# Patient Record
Sex: Female | Born: 1985 | Race: Black or African American | Hispanic: No | Marital: Single | Smoking: Current every day smoker
Health system: Southern US, Community
[De-identification: ages and names within clinical notes are randomized; demographics above are authoritative.]

---

## 2013-12-07 ENCOUNTER — Emergency Department (HOSPITAL_BASED_OUTPATIENT_CLINIC_OR_DEPARTMENT_OTHER)
Admission: EM | Admit: 2013-12-07 | Discharge: 2013-12-07 | Disposition: A | Discharge status: Home / Self Care | Payer: Medicaid - Out of State | Attending: Emergency Medicine | Admitting: Emergency Medicine

## 2013-12-07 ENCOUNTER — Encounter (HOSPITAL_BASED_OUTPATIENT_CLINIC_OR_DEPARTMENT_OTHER): Payer: Self-pay

## 2013-12-07 ENCOUNTER — Emergency Department (HOSPITAL_BASED_OUTPATIENT_CLINIC_OR_DEPARTMENT_OTHER): Payer: Medicaid - Out of State

## 2013-12-07 DIAGNOSIS — R112 Nausea with vomiting, unspecified: Secondary | ICD-10-CM | POA: Insufficient documentation

## 2013-12-07 DIAGNOSIS — Z3202 Encounter for pregnancy test, result negative: Secondary | ICD-10-CM | POA: Diagnosis not present

## 2013-12-07 DIAGNOSIS — Z720 Tobacco use: Secondary | ICD-10-CM | POA: Diagnosis not present

## 2013-12-07 DIAGNOSIS — R1084 Generalized abdominal pain: Secondary | ICD-10-CM

## 2013-12-07 DIAGNOSIS — H538 Other visual disturbances: Secondary | ICD-10-CM | POA: Diagnosis not present

## 2013-12-07 DIAGNOSIS — Z9889 Other specified postprocedural states: Secondary | ICD-10-CM | POA: Diagnosis not present

## 2013-12-07 DIAGNOSIS — R519 Headache, unspecified: Secondary | ICD-10-CM

## 2013-12-07 DIAGNOSIS — R51 Headache: Secondary | ICD-10-CM | POA: Diagnosis not present

## 2013-12-07 LAB — COMPREHENSIVE METABOLIC PANEL
ALT: 25 U/L (ref 0–35)
AST: 24 U/L (ref 0–37)
Albumin: 4.2 g/dL (ref 3.5–5.2)
Alkaline Phosphatase: 49 U/L (ref 39–117)
Anion gap: 12 (ref 5–15)
BUN: 10 mg/dL (ref 6–23)
CALCIUM: 10.3 mg/dL (ref 8.4–10.5)
CO2: 27 mEq/L (ref 19–32)
CREATININE: 0.7 mg/dL (ref 0.50–1.10)
Chloride: 102 mEq/L (ref 96–112)
GFR calc non Af Amer: >90 mL/min (ref >90)
Glucose, Bld: 98 mg/dL (ref 70–99)
Potassium: 4.6 mEq/L (ref 3.7–5.3)
Sodium: 141 mEq/L (ref 137–147)
Total Bilirubin: 0.6 mg/dL (ref 0.3–1.2)
Total Protein: 8.2 g/dL (ref 6.0–8.3)

## 2013-12-07 LAB — CBC
HCT: 40.6 % (ref 36.0–46.0)
HEMOGLOBIN: 12.9 g/dL (ref 12.0–15.0)
MCH: 27.8 pg (ref 26.0–34.0)
MCHC: 31.8 g/dL (ref 30.0–36.0)
MCV: 87.5 fL (ref 78.0–100.0)
Platelets: 299 10*3/uL (ref 150–400)
RBC: 4.64 MIL/uL (ref 3.87–5.11)
RDW: 12.3 % (ref 11.5–15.5)
WBC: 4.3 10*3/uL (ref 4.0–10.5)

## 2013-12-07 LAB — URINALYSIS, ROUTINE W REFLEX MICROSCOPIC
BILIRUBIN URINE: NEGATIVE
Glucose, UA: NEGATIVE mg/dL
HGB URINE DIPSTICK: NEGATIVE
KETONES UR: NEGATIVE mg/dL
Leukocytes, UA: NEGATIVE
NITRITE: NEGATIVE
Protein, ur: NEGATIVE mg/dL
SPECIFIC GRAVITY, URINE: 1.021 (ref 1.005–1.030)
UROBILINOGEN UA: 0.2 mg/dL (ref 0.0–1.0)
pH: 6.5 (ref 5.0–8.0)

## 2013-12-07 LAB — PREGNANCY, URINE: PREG TEST UR: NEGATIVE

## 2013-12-07 LAB — LIPASE, BLOOD: LIPASE: 18 U/L (ref 11–59)

## 2013-12-07 MED ORDER — SODIUM CHLORIDE 0.9 % IV BOLUS (SEPSIS)
1000.0000 mL | Freq: Once | INTRAVENOUS | Status: AC
  Administered 2013-12-07: 1000 mL via INTRAVENOUS

## 2013-12-07 MED ORDER — ONDANSETRON HCL 4 MG/2ML IJ SOLN
4.0000 mg | Freq: Once | INTRAMUSCULAR | Status: AC
  Administered 2013-12-07: 4 mg via INTRAVENOUS
  Filled 2013-12-07: qty 2

## 2013-12-07 MED ORDER — METOCLOPRAMIDE HCL 5 MG/ML IJ SOLN
10.0000 mg | Freq: Once | INTRAMUSCULAR | Status: AC
  Administered 2013-12-07: 10 mg via INTRAVENOUS
  Filled 2013-12-07: qty 2

## 2013-12-07 MED ORDER — KETOROLAC TROMETHAMINE 30 MG/ML IJ SOLN
30.0000 mg | Freq: Once | INTRAMUSCULAR | Status: AC
  Administered 2013-12-07: 30 mg via INTRAVENOUS
  Filled 2013-12-07: qty 1

## 2013-12-07 MED ORDER — DIPHENHYDRAMINE HCL 50 MG/ML IJ SOLN
12.5000 mg | Freq: Once | INTRAMUSCULAR | Status: AC
Start: 2013-12-07 — End: 2013-12-07
  Administered 2013-12-07: 12.5 mg via INTRAVENOUS
  Filled 2013-12-07: qty 1

## 2013-12-07 MED ORDER — PROCHLORPERAZINE EDISYLATE 5 MG/ML IJ SOLN
10.0000 mg | Freq: Once | INTRAMUSCULAR | Status: DC
  Filled 2013-12-07: qty 2

## 2013-12-07 NOTE — ED Notes (Signed)
C/o vomiting x 3 days-then states HA and blurred vision started last week

## 2013-12-07 NOTE — ED Provider Notes (Signed)
CSN: [id number]     Arrival date & time 12/07/13  1145 History   First MD Initiated Contact with Patient 12/07/13 1353     Chief Complaint  Patient presents with  . Emesis  . Headache     (Consider location/radiation/quality/duration/timing/severity/associated sxs/prior Treatment) Patient is a 28 y.o. female presenting with vomiting and headaches. The history is provided by the patient and medical records. No language interpreter was used.  Emesis Associated symptoms: abdominal pain and headaches   Associated symptoms: no diarrhea   Headache Associated symptoms: abdominal pain and vomiting   Associated symptoms: no back pain, no cough, no diarrhea, no fatigue, no fever, no nausea and no neck stiffness      Nichole Ross is a 28 y.o. female  with no major medical Hx presents to the Emergency Department complaining of gradual, persistent, progressively worsening headache onset 8 days ago.  Pain described as frontal, throbbing and sharp pain. Associated symptoms includes intermittent blurred vision which began with the headache, but is not present how.  Pt reports that 3 days ago she began to have vomiting.  Pt with 3 episodes of NBNB emesis in the last 3 days.  Pt reports lower abd pain beginning after her bouts of emesis.  She has not attempted ant treatments PTA and has no hx of migraines.  Pt denies numbness, tingling, paresthesias, fever, chill, neck stiffness, rash, vaginal discharge.  Pt denies aggravating or alleviating factors.       History reviewed. No pertinent past medical history. Past Surgical History  Procedure Laterality Date  . Cesarean section     No family history on file. History  Substance Use Topics  . Smoking status: Current Every Day Smoker  . Smokeless tobacco: Not on file  . Alcohol Use: No   OB History    No data available     Review of Systems  Constitutional: Negative for fever, diaphoresis, appetite change, fatigue and unexpected weight change.  HENT:  Negative for mouth sores.   Eyes: Negative for visual disturbance.  Respiratory: Negative for cough, chest tightness, shortness of breath and wheezing.   Cardiovascular: Negative for chest pain.  Gastrointestinal: Positive for vomiting and abdominal pain. Negative for nausea, diarrhea and constipation.  Endocrine: Negative for polydipsia, polyphagia and polyuria.  Genitourinary: Negative for dysuria, urgency, frequency and hematuria.  Musculoskeletal: Negative for back pain and neck stiffness.  Skin: Negative for rash.  Allergic/Immunologic: Negative for immunocompromised state.  Neurological: Positive for headaches. Negative for syncope and light-headedness.  Hematological: Does not bruise/bleed easily.  Psychiatric/Behavioral: Negative for sleep disturbance. The patient is not nervous/anxious.       Allergies  Review of patient's allergies indicates no known allergies.  Home Medications   Prior to Admission medications   Not on File   BP 101/59 mmHg  Pulse 70  Temp(Src) 97.8 F (36.6 C) (Oral)  Resp 16  Ht 5\' 3"  (1.6 m)  Wt 180 lb (81.647 kg)  BMI 31.89 kg/m2  SpO2 100%  LMP 11/24/2013 Physical Exam  Constitutional: She is oriented to person, place, and time. She appears well-developed and well-nourished. No distress.  HENT:  Head: Normocephalic and atraumatic.  Mouth/Throat: Oropharynx is clear and moist.  Eyes: Conjunctivae and EOM are normal. Pupils are equal, round, and reactive to light. No scleral icterus.  No horizontal, vertical or rotational nystagmus  Neck: Normal range of motion. Neck supple.  Full active and passive ROM without pain No midline or paraspinal tenderness No nuchal rigidity or  meningeal signs  Cardiovascular: Normal rate, regular rhythm, normal heart sounds and intact distal pulses.   No murmur heard. Pulmonary/Chest: Effort normal and breath sounds normal. No respiratory distress. She has no wheezes. She has no rales.  Abdominal: Soft.  Bowel sounds are normal. There is generalized tenderness. There is no rebound, no guarding and no CVA tenderness.  Obese Soft and mildly tender throughout  Musculoskeletal: Normal range of motion.  Lymphadenopathy:    She has no cervical adenopathy.  Neurological: She is alert and oriented to person, place, and time. She has normal reflexes. No cranial nerve deficit. She exhibits normal muscle tone. Coordination normal.  Mental Status:  Alert, oriented, thought content appropriate. Speech fluent without evidence of aphasia. Able to follow 2 step commands without difficulty.  Cranial Nerves:  II:  Peripheral visual fields grossly normal, pupils equal, round, reactive to light III,IV, VI: ptosis not present, extra-ocular motions intact bilaterally  V,VII: smile symmetric, facial light touch sensation equal VIII: hearing grossly normal bilaterally  IX,X: gag reflex present  XI: bilateral shoulder shrug equal and strong XII: midline tongue extension  Motor:  5/5 in upper and lower extremities bilaterally including strong and equal grip strength and dorsiflexion/plantar flexion Sensory: Pinprick and light touch normal in all extremities.  Deep Tendon Reflexes: 2+ and symmetric  Cerebellar: normal finger-to-nose with bilateral upper extremities Gait: normal gait and balance CV: distal pulses palpable throughout   Skin: Skin is warm and dry. No rash noted. She is not diaphoretic.  Psychiatric: She has a normal mood and affect. Her behavior is normal. Judgment and thought content normal.  Nursing note and vitals reviewed.   ED Course  Procedures (including critical care time) Labs Review Labs Reviewed  URINALYSIS, ROUTINE W REFLEX MICROSCOPIC  CBC  PREGNANCY, URINE  COMPREHENSIVE METABOLIC PANEL  LIPASE, BLOOD    Imaging Review Ct Head Wo Contrast  12/07/2013   CLINICAL DATA:  Headache and blurred vision beginning last week with vomiting.  EXAM: CT HEAD WITHOUT CONTRAST  TECHNIQUE:  Contiguous axial images were obtained from the base of the skull through the vertex without intravenous contrast.  COMPARISON:  None.  FINDINGS: The brain appears normal without hemorrhage, infarct, mass lesion, mass effect, midline shift or abnormal extra-axial fluid collection. No hydrocephalus or pneumocephalus. The calvarium is intact. Imaged paranasal sinuses and mastoid air cells are clear.  IMPRESSION: Negative head CT.   Electronically Signed   By: Drusilla Kannerhomas  Dalessio M.D.   On: 12/07/2013 15:02     EKG Interpretation None      MDM   Final diagnoses:  Headache  Generalized abdominal pain  Non-intractable vomiting with nausea, vomiting of unspecified type   Nichole Ross presents with frontal headache and generalized abd pain.  Pt HA treated and improved while in ED.  Presentation is like pts previous HA and non concerning [facility], ICH, Meningitis, or temporal arteritis. Pt is afebrile with no focal neuro deficits, nuchal rigidity, or change in vision. Pt is to follow up with PCP and neurology to discuss prophylactic medication. Pt verbalizes understanding and is agreeable with plan to dc.   Pt with completely resolved abdominal pain after resolution of headache. No emesis here in the emergency department and she is tolerating by mouth without difficulty. Repeat abdominal exam is soft and without tenderness.  No concern for diverticulitis, UTI, ectopic pregnancy, appendicitis, cholecystitis or colitis.  I have personally reviewed patient's vitals, nursing note and any pertinent labs or imaging.  I performed an  undressed physical exam.    It has been determined that no acute conditions requiring further emergency intervention are present at this time. The patient/guardian have been advised of the diagnosis and plan. I reviewed all labs and imaging including any potential incidental findings. We have discussed signs and symptoms that warrant return to the ED and they are listed in the discharge  instructions.    Vital signs are stable at discharge.   BP 101/59 mmHg  Pulse 70  Temp(Src) 97.8 F (36.6 C) (Oral)  Resp 16  Ht 5\' 3"  (1.6 m)  Wt 180 lb (81.647 kg)  BMI 31.89 kg/m2  SpO2 100%  LMP 11/24/2013         Dierdre ForthHannah Ival Basquez, PA-C 12/07/13 1656  Warnell Foresterrey Wofford, MD 12/07/13 1731

## 2013-12-07 NOTE — Discharge Instructions (Signed)
1. Medications: usual home medications 2. Treatment: rest, drink plenty of fluids,  3. Follow Up: Please followup with your primary doctor and or neurology in 7 days for discussion of your diagnoses and further evaluation after today's visit; if you do not have a primary care doctor use the resource guide provided to find one; Please return to the ER for worsening symptoms,    General Headache Without Cause A headache is pain or discomfort felt around the head or neck area. The specific cause of a headache may not be found. There are many causes and types of headaches. A few common ones are:  Tension headaches.  Migraine headaches.  Cluster headaches.  Chronic daily headaches. HOME CARE INSTRUCTIONS   Keep all follow-up appointments with your caregiver or any specialist referral.  Only take over-the-counter or prescription medicines for pain or discomfort as directed by your caregiver.  Lie down in a dark, quiet room when you have a headache.  Keep a headache journal to find out what may trigger your migraine headaches. For example, write down:  What you eat and drink.  How much sleep you get.  Any change to your diet or medicines.  Try massage or other relaxation techniques.  Put ice packs or heat on the head and neck. Use these 3 to 4 times per day for 15 to 20 minutes each time, or as needed.  Limit stress.  Sit up straight, and do not tense your muscles.  Quit smoking if you smoke.  Limit alcohol use.  Decrease the amount of caffeine you drink, or stop drinking caffeine.  Eat and sleep on a regular schedule.  Get 7 to 9 hours of sleep, or as recommended by your caregiver.  Keep lights dim if bright lights bother you and make your headaches worse. SEEK MEDICAL CARE IF:   You have problems with the medicines you were prescribed.  Your medicines are not working.  You have a change from the usual headache.  You have nausea or vomiting. SEEK IMMEDIATE MEDICAL  CARE IF:   Your headache becomes severe.  You have a fever.  You have a stiff neck.  You have loss of vision.  You have muscular weakness or loss of muscle control.  You start losing your balance or have trouble walking.  You feel faint or pass out.  You have severe symptoms that are different from your first symptoms. MAKE SURE YOU:   Understand these instructions.  Will watch your condition.  Will get help right away if you are not doing well or get worse. Document Released: 12/23/2004 Document Revised: 03/17/2011 Document Reviewed: 01/08/2011 [facility] Patient Information 2015 [employer] [place]. This information is not intended to replace advice given to you by your health care provider. Make sure you discuss any questions you have with your health care provider.

## 2016-02-12 IMAGING — CT CT HEAD W/O CM
1 series · 16 of 30 positions shown, 20 images · non-contrast
Comparison: None.

CLINICAL DATA: Headache and blurred vision beginning last week with
vomiting.

EXAM:
CT HEAD WITHOUT CONTRAST
TECHNIQUE: Contiguous axial images were obtained from the base of the skull
through the vertex without intravenous contrast.

[Series 2: head 4.8 h37s · axial · 0.45mm/px · z∈[+1164,+1297]mm · 16 of 32 slices shown, 20 images]
[im 2/32  brain]
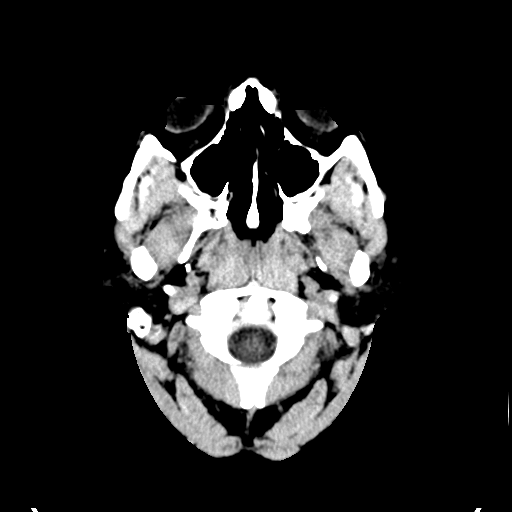
[im 2/32  bone]
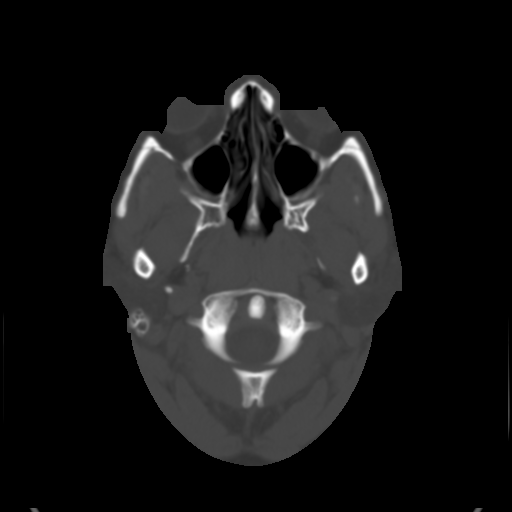
[im 4/32  brain]
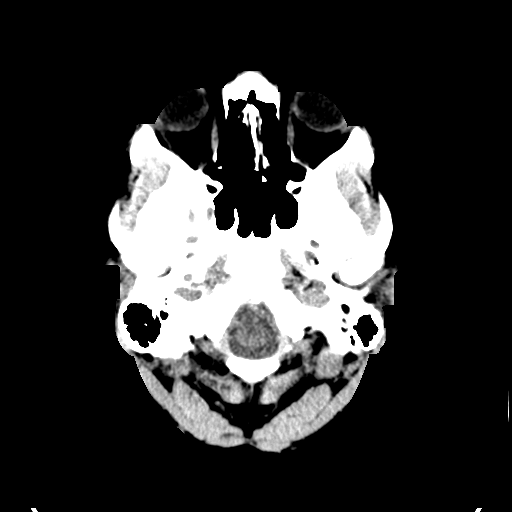
[im 6/32  brain]
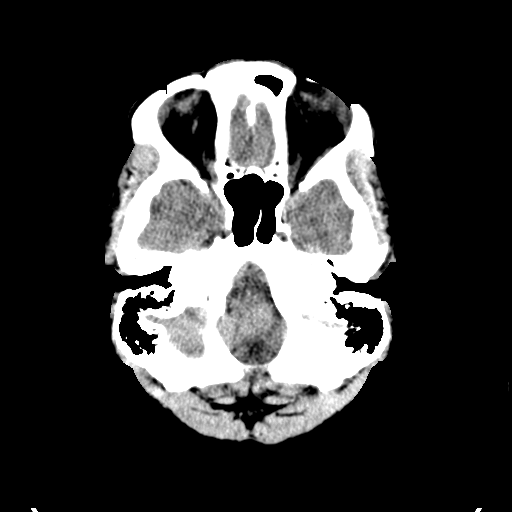
[im 8/32  brain]
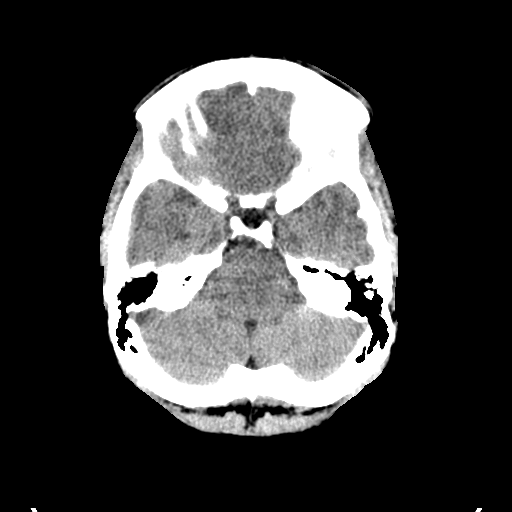
[im 9/32  brain]
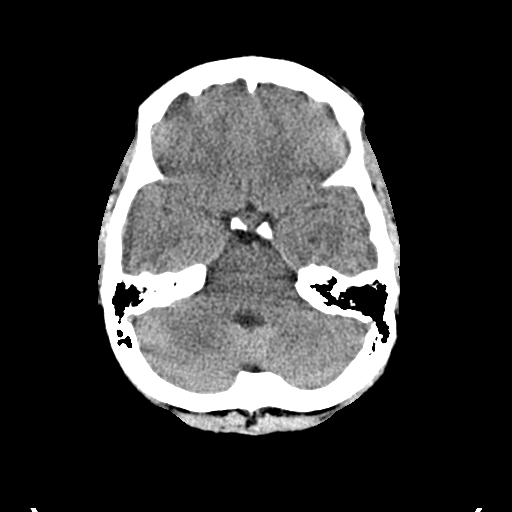
[im 9/32  bone]
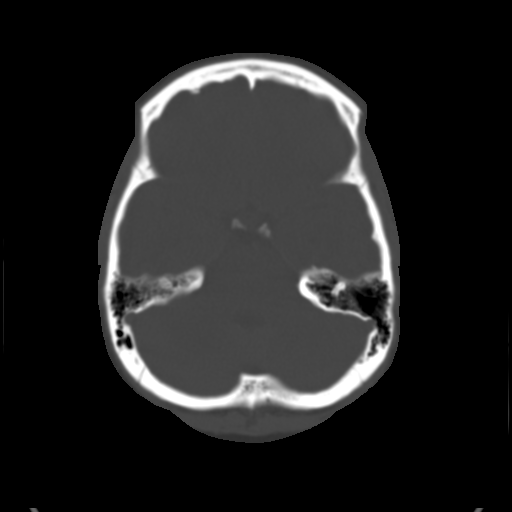
[im 11/32  brain]
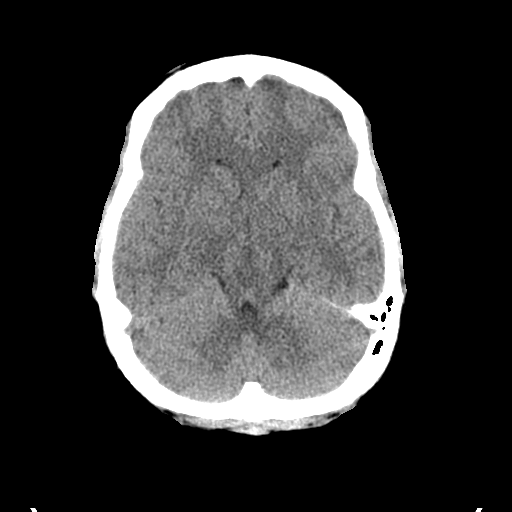
[im 13/32  brain]
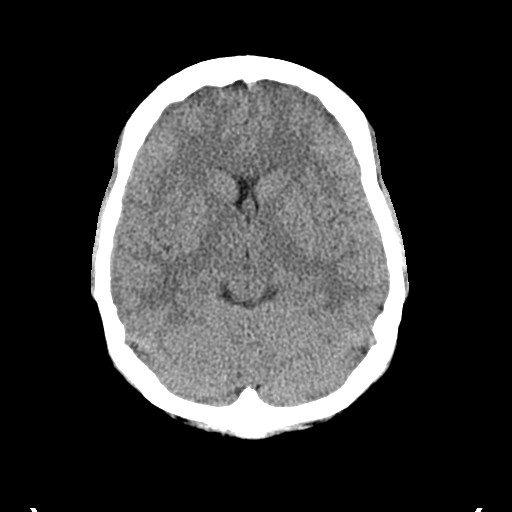
[im 15/32  brain]
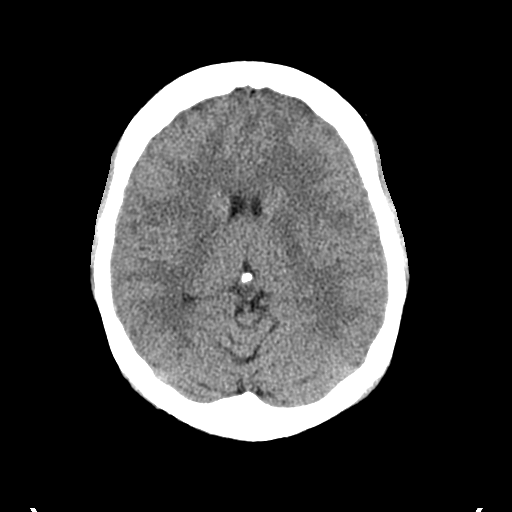
[im 17/32  brain]
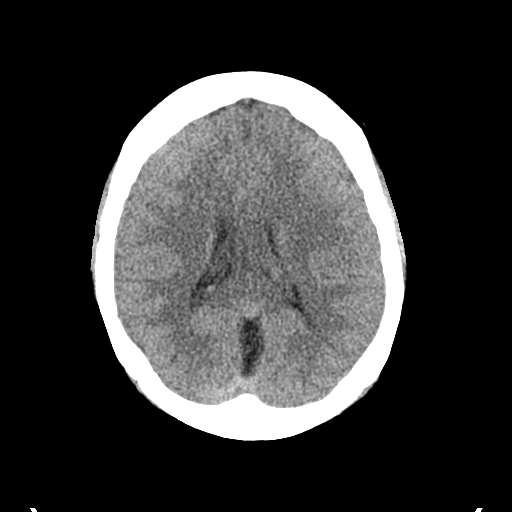
[im 17/32  bone]
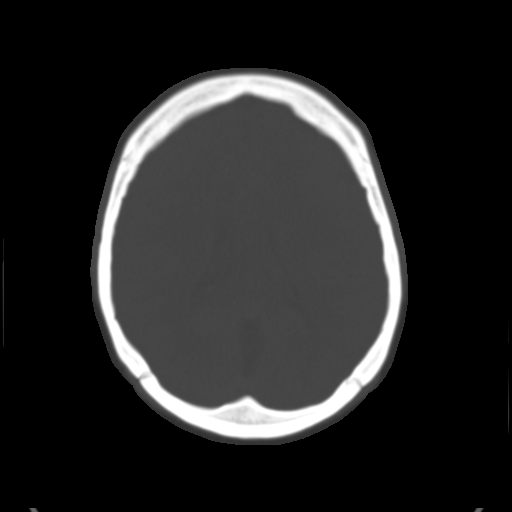
[im 19/32  brain]
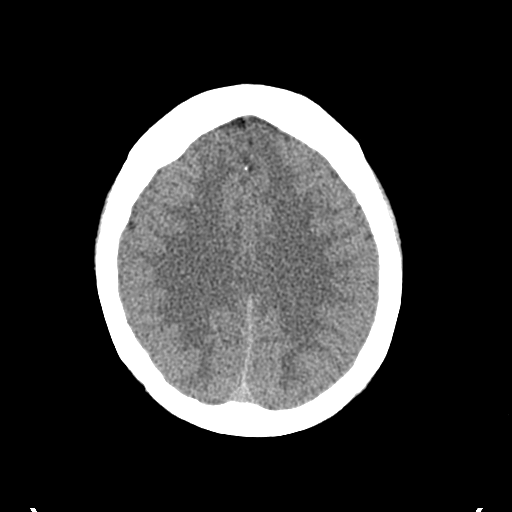
[im 21/32  brain]
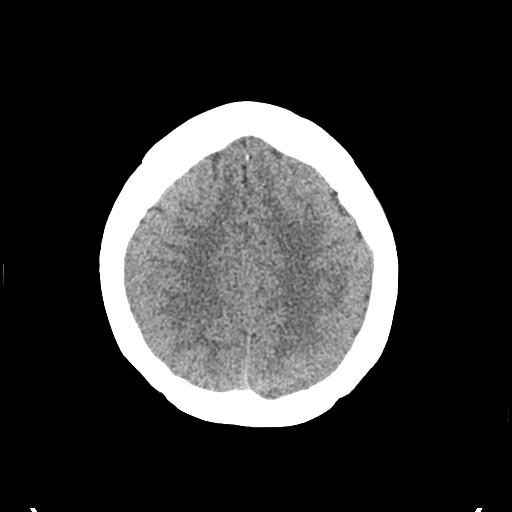
[im 23/32  brain]
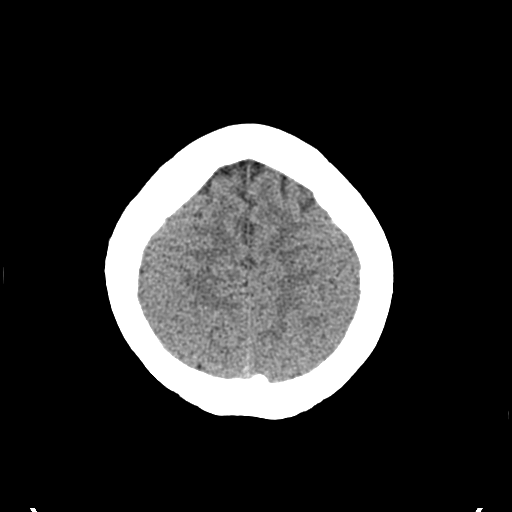
[im 24/32  brain]
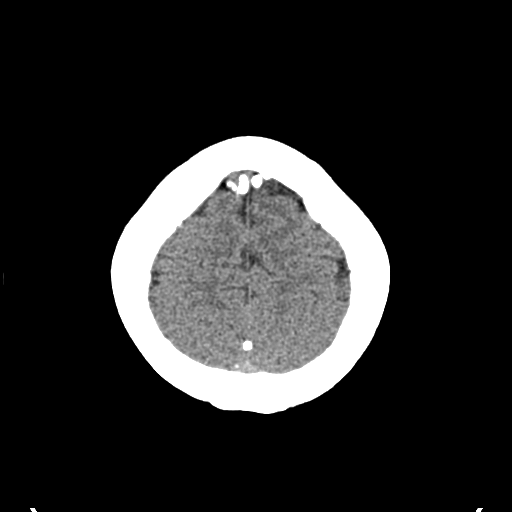
[im 24/32  bone]
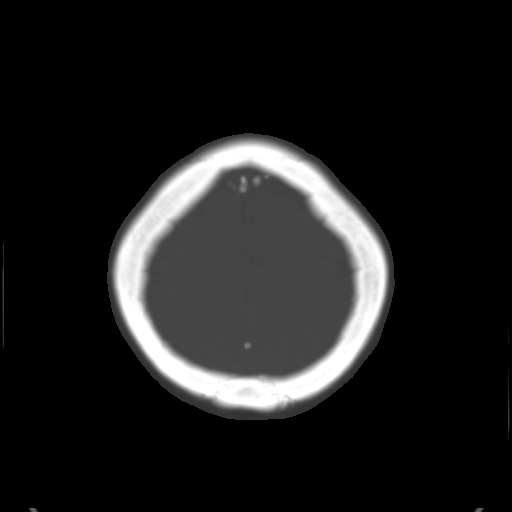
[im 26/32  brain]
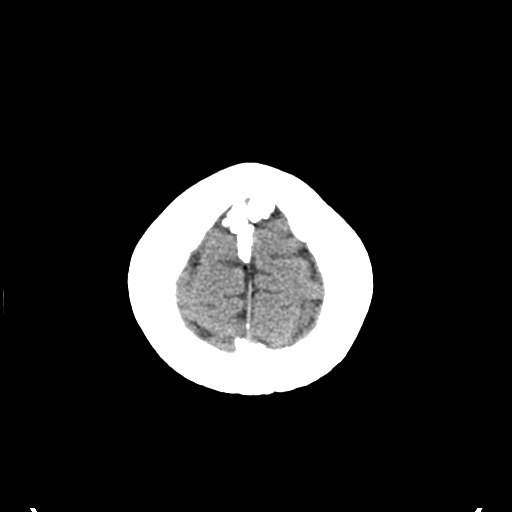
[im 28/32  brain]
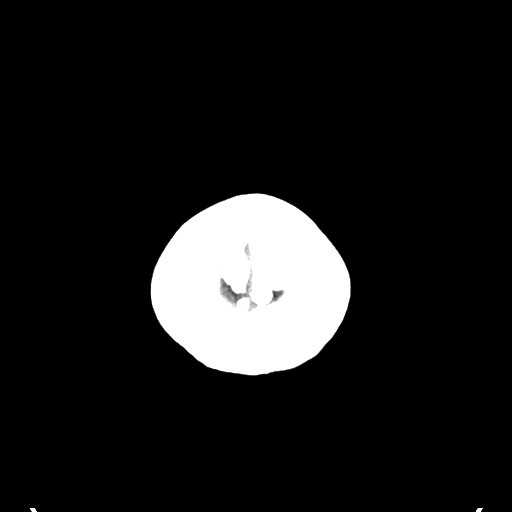
[im 30/32  brain]
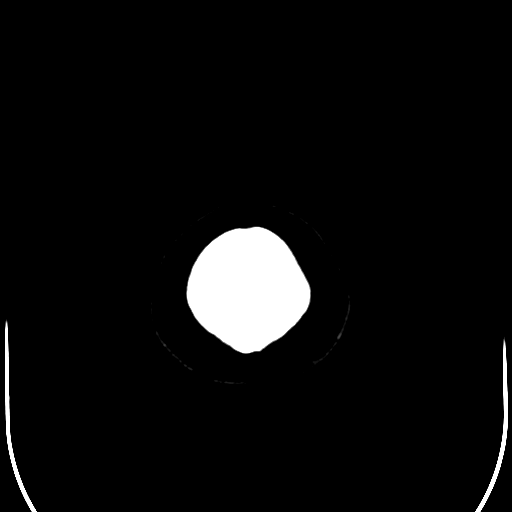

[16 of 30 positions shown; findings below may reference images not displayed]

FINDINGS: The brain appears normal without hemorrhage, infarct, mass lesion,
mass effect, midline shift or abnormal extra-axial fluid collection.
No hydrocephalus or pneumocephalus. The calvarium is intact. Imaged
paranasal sinuses and mastoid air cells are clear.
IMPRESSION: Negative head CT.
